# Patient Record
Sex: Female | Born: 2000 | Race: White | Hispanic: No | Marital: Single | Smoking: Never smoker
Health system: Southern US, Community
[De-identification: ages and names within clinical notes are randomized; demographics above are authoritative.]

---
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
## 2022-05-11 DIAGNOSIS — Z419 Encounter for procedure for purposes other than remedying health state, unspecified: Secondary | ICD-10-CM | POA: Diagnosis not present

## 2022-06-11 DIAGNOSIS — Z419 Encounter for procedure for purposes other than remedying health state, unspecified: Secondary | ICD-10-CM | POA: Diagnosis not present

## 2022-07-10 DIAGNOSIS — Z419 Encounter for procedure for purposes other than remedying health state, unspecified: Secondary | ICD-10-CM | POA: Diagnosis not present

## 2022-08-01 DIAGNOSIS — R519 Headache, unspecified: Secondary | ICD-10-CM | POA: Diagnosis not present

## 2022-08-01 DIAGNOSIS — R110 Nausea: Secondary | ICD-10-CM | POA: Diagnosis not present

## 2022-08-01 DIAGNOSIS — J069 Acute upper respiratory infection, unspecified: Secondary | ICD-10-CM | POA: Diagnosis not present

## 2022-08-10 DIAGNOSIS — Z419 Encounter for procedure for purposes other than remedying health state, unspecified: Secondary | ICD-10-CM | POA: Diagnosis not present

## 2022-09-09 DIAGNOSIS — Z419 Encounter for procedure for purposes other than remedying health state, unspecified: Secondary | ICD-10-CM | POA: Diagnosis not present

## 2022-10-10 DIAGNOSIS — Z419 Encounter for procedure for purposes other than remedying health state, unspecified: Secondary | ICD-10-CM | POA: Diagnosis not present

## 2022-10-21 ENCOUNTER — Ambulatory Visit: Payer: No Typology Code available for payment source | Admitting: Family Medicine

## 2022-10-28 ENCOUNTER — Ambulatory Visit: Payer: Medicaid Other | Admitting: Nurse Practitioner

## 2022-10-28 DIAGNOSIS — Z7689 Persons encountering health services in other specified circumstances: Secondary | ICD-10-CM

## 2022-10-28 NOTE — Progress Notes (Deleted)
   New Patient Office Visit  Subjective    Patient ID: Sierra Wilson, female    DOB: [birth year]  Age: 22 y.o. MRN: [id number]  CC: No chief complaint on file.   HPI Sierra Wilson presents to establish care ***  Outpatient Encounter Medications as of 10/28/2022  Medication Sig   nitrofurantoin, macrocrystal-monohydrate, (MACROBID) 100 MG capsule Take 1 capsule (100 mg total) by mouth 2 (two) times daily. Take with food.   No facility-administered encounter medications on file as of 10/28/2022.    No past medical history on file.  No past surgical history on file.  No family history on file.  Social History   Socioeconomic History   Marital status: Single    Spouse name: Not on file   Number of children: Not on file   Years of education: Not on file   Highest education level: Not on file  Occupational History   Not on file  Tobacco Use   Smoking status: Never   Smokeless tobacco: Never  Vaping Use   Vaping Use: Never used  Substance and Sexual Activity   Alcohol use: No   Drug use: Not on file   Sexual activity: Not on file  Other Topics Concern   Not on file  Social History Narrative   Not on file   Social Determinants of Health   Financial Resource Strain: Not on file  Food Insecurity: Not on file  Transportation Needs: Not on file  Physical Activity: Not on file  Stress: Not on file  Social Connections: Not on file  Intimate Partner Violence: Not on file    ROS Negative unless indicated in HPI   Objective    There were no vitals taken for this visit.  Physical Exam  {Labs (Optional):23779}    Assessment & Plan:  Encounter to establish care    No follow-ups on file.   Arrie Aran Santa Lighter, NP

## 2022-11-09 DIAGNOSIS — Z419 Encounter for procedure for purposes other than remedying health state, unspecified: Secondary | ICD-10-CM | POA: Diagnosis not present

## 2022-11-26 DIAGNOSIS — N6452 Nipple discharge: Secondary | ICD-10-CM | POA: Diagnosis not present

## 2022-11-26 DIAGNOSIS — L988 Other specified disorders of the skin and subcutaneous tissue: Secondary | ICD-10-CM | POA: Diagnosis not present

## 2022-11-26 DIAGNOSIS — F411 Generalized anxiety disorder: Secondary | ICD-10-CM | POA: Diagnosis not present

## 2022-11-26 DIAGNOSIS — N644 Mastodynia: Secondary | ICD-10-CM | POA: Diagnosis not present

## 2022-12-10 DIAGNOSIS — Z419 Encounter for procedure for purposes other than remedying health state, unspecified: Secondary | ICD-10-CM | POA: Diagnosis not present

## 2023-01-10 DIAGNOSIS — Z419 Encounter for procedure for purposes other than remedying health state, unspecified: Secondary | ICD-10-CM | POA: Diagnosis not present

## 2023-02-14 DIAGNOSIS — B379 Candidiasis, unspecified: Secondary | ICD-10-CM | POA: Diagnosis not present

## 2024-05-06 ENCOUNTER — Ambulatory Visit: Payer: Self-pay
